# Patient Record
Sex: Female | Born: 1990 | Race: Black or African American | Hispanic: No | Marital: Single | Smoking: Current some day smoker
Health system: Southern US, Community
[De-identification: ages and names within clinical notes are randomized; demographics above are authoritative.]

## PROBLEM LIST (undated history)

## (undated) DIAGNOSIS — D649 Anemia, unspecified: Secondary | ICD-10-CM

## (undated) HISTORY — DX: Anemia, unspecified: D64.9

---

## 2017-06-13 ENCOUNTER — Emergency Department (HOSPITAL_COMMUNITY)
Admission: EM | Admit: 2017-06-13 | Discharge: 2017-06-13 | Disposition: A | Discharge status: Home / Self Care | Payer: No Typology Code available for payment source | Attending: Emergency Medicine | Admitting: Emergency Medicine

## 2017-06-13 ENCOUNTER — Encounter (HOSPITAL_COMMUNITY): Payer: Self-pay | Admitting: Nurse Practitioner

## 2017-06-13 ENCOUNTER — Emergency Department (HOSPITAL_COMMUNITY): Payer: No Typology Code available for payment source

## 2017-06-13 DIAGNOSIS — S63502A Unspecified sprain of left wrist, initial encounter: Secondary | ICD-10-CM | POA: Diagnosis not present

## 2017-06-13 DIAGNOSIS — Y999 Unspecified external cause status: Secondary | ICD-10-CM | POA: Insufficient documentation

## 2017-06-13 DIAGNOSIS — Z79899 Other long term (current) drug therapy: Secondary | ICD-10-CM | POA: Diagnosis not present

## 2017-06-13 DIAGNOSIS — Y9389 Activity, other specified: Secondary | ICD-10-CM | POA: Diagnosis not present

## 2017-06-13 DIAGNOSIS — F17210 Nicotine dependence, cigarettes, uncomplicated: Secondary | ICD-10-CM | POA: Diagnosis not present

## 2017-06-13 DIAGNOSIS — Y929 Unspecified place or not applicable: Secondary | ICD-10-CM | POA: Insufficient documentation

## 2017-06-13 DIAGNOSIS — M25561 Pain in right knee: Secondary | ICD-10-CM

## 2017-06-13 LAB — POC URINE PREG, ED: PREG TEST UR: NEGATIVE

## 2017-06-13 MED ORDER — KETOROLAC TROMETHAMINE 60 MG/2ML IM SOLN
60.0000 mg | Freq: Once | INTRAMUSCULAR | Status: AC
  Administered 2017-06-13: 60 mg via INTRAMUSCULAR
  Filled 2017-06-13: qty 2

## 2017-06-13 MED ORDER — CYCLOBENZAPRINE HCL 10 MG PO TABS: 10.0000 mg | ORAL_TABLET | Freq: Two times a day (BID) | ORAL | 0 refills | Status: DC | PRN

## 2017-06-13 MED ORDER — NAPROXEN 375 MG PO TABS: 375.0000 mg | ORAL_TABLET | Freq: Two times a day (BID) | ORAL | 0 refills | Status: AC | PRN

## 2017-06-13 NOTE — ED Notes (Signed)
SPLINT TO LUE INTACT. GOOD CMS
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
## 2017-06-13 NOTE — ED Notes (Signed)
ED Provider at bedside. 
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
## 2017-06-13 NOTE — Discharge Instructions (Signed)
You can wear your splint for the next 1-2 weeks as needed for comfort. Do not wear it at night.

## 2017-06-13 NOTE — ED Notes (Signed)
[facility]et [facility] COMPLETING SPLINT.
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
## 2017-06-22 ENCOUNTER — Emergency Department (HOSPITAL_COMMUNITY)
Admission: EM | Admit: 2017-06-22 | Discharge: 2017-06-22 | Disposition: A | Discharge status: Home / Self Care | Payer: No Typology Code available for payment source | Attending: Emergency Medicine | Admitting: Emergency Medicine

## 2017-06-22 ENCOUNTER — Encounter (HOSPITAL_COMMUNITY): Payer: Self-pay | Admitting: Emergency Medicine

## 2017-06-22 DIAGNOSIS — M62838 Other muscle spasm: Secondary | ICD-10-CM | POA: Diagnosis not present

## 2017-06-22 DIAGNOSIS — M549 Dorsalgia, unspecified: Secondary | ICD-10-CM | POA: Insufficient documentation

## 2017-06-22 DIAGNOSIS — M542 Cervicalgia: Secondary | ICD-10-CM | POA: Diagnosis present

## 2017-06-22 DIAGNOSIS — Z79899 Other long term (current) drug therapy: Secondary | ICD-10-CM | POA: Insufficient documentation

## 2017-06-22 DIAGNOSIS — F17201 Nicotine dependence, unspecified, in remission: Secondary | ICD-10-CM | POA: Insufficient documentation

## 2017-06-22 NOTE — ED Notes (Signed)
ED Provider at bedside. 
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
## 2017-06-22 NOTE — Discharge Instructions (Signed)
Please read instructions below. Apply ice to your pain for 20 minutes at a time. You can also apply heat. You can do gentle stretches or massage as well. Drink plenty of water. You can take aleve every 12 hours as needed for pain. You can take 1/2 tab to 1 tab of flexeril (cyclobenzaprine) at bedtime for muscle spasm. Follow up with your primary care provider if symptoms persist. Return to the ER for new or concerning symptoms.

## 2017-06-29 ENCOUNTER — Ambulatory Visit: Payer: No Typology Code available for payment source | Attending: Internal Medicine | Admitting: Physician Assistant

## 2017-06-29 DIAGNOSIS — Z720 Tobacco use: Secondary | ICD-10-CM | POA: Insufficient documentation

## 2017-06-29 DIAGNOSIS — S139XXA Sprain of joints and ligaments of unspecified parts of neck, initial encounter: Secondary | ICD-10-CM | POA: Insufficient documentation

## 2017-06-29 DIAGNOSIS — S63502A Unspecified sprain of left wrist, initial encounter: Secondary | ICD-10-CM | POA: Insufficient documentation

## 2017-06-29 DIAGNOSIS — S8001XA Contusion of right knee, initial encounter: Secondary | ICD-10-CM | POA: Diagnosis present

## 2017-06-29 DIAGNOSIS — Z79899 Other long term (current) drug therapy: Secondary | ICD-10-CM | POA: Diagnosis not present

## 2017-06-29 DIAGNOSIS — M542 Cervicalgia: Secondary | ICD-10-CM

## 2017-06-29 MED ORDER — NAPROXEN 500 MG PO TABS: 500.0000 mg | ORAL_TABLET | Freq: Two times a day (BID) | ORAL | 0 refills | Status: AC

## 2017-06-29 MED ORDER — CYCLOBENZAPRINE HCL 10 MG PO TABS: 10.0000 mg | ORAL_TABLET | Freq: Every day | ORAL | 0 refills | Status: AC
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
## 2017-07-27 ENCOUNTER — Ambulatory Visit: Payer: Self-pay | Admitting: Nurse Practitioner

## 2017-08-01 ENCOUNTER — Encounter: Payer: Self-pay | Admitting: Nurse Practitioner

## 2017-08-01 ENCOUNTER — Ambulatory Visit: Payer: No Typology Code available for payment source | Attending: Nurse Practitioner | Admitting: Nurse Practitioner

## 2017-08-01 DIAGNOSIS — D649 Anemia, unspecified: Secondary | ICD-10-CM | POA: Diagnosis not present

## 2017-08-01 DIAGNOSIS — M791 Myalgia, unspecified site: Secondary | ICD-10-CM

## 2017-08-01 DIAGNOSIS — M7918 Myalgia, other site: Secondary | ICD-10-CM | POA: Insufficient documentation

## 2017-08-01 DIAGNOSIS — M25532 Pain in left wrist: Secondary | ICD-10-CM | POA: Diagnosis present
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
